# Patient Record
Sex: Male | Born: 1965 | Race: Asian | Hispanic: No | Marital: Married
Health system: Southern US, Community
[De-identification: ages and names within clinical notes are randomized; demographics above are authoritative.]

## PROBLEM LIST (undated history)

## (undated) ENCOUNTER — Emergency Department (HOSPITAL_BASED_OUTPATIENT_CLINIC_OR_DEPARTMENT_OTHER): Admission: EM | Payer: Self-pay | Source: Home / Self Care

---

## 1999-02-17 ENCOUNTER — Ambulatory Visit (HOSPITAL_COMMUNITY): Admission: RE | Admit: 1999-02-17 | Discharge: 1999-02-17 | Payer: Self-pay | Admitting: *Deleted

## 1999-03-08 ENCOUNTER — Encounter: Payer: Self-pay | Admitting: Urology

## 1999-03-08 ENCOUNTER — Ambulatory Visit (HOSPITAL_COMMUNITY): Admission: RE | Admit: 1999-03-08 | Discharge: 1999-03-08 | Payer: Self-pay | Admitting: Urology

## 2002-05-10 ENCOUNTER — Emergency Department (HOSPITAL_COMMUNITY): Admission: EM | Admit: 2002-05-10 | Discharge: 2002-05-10 | Payer: Self-pay | Admitting: Emergency Medicine

## 2003-05-12 ENCOUNTER — Emergency Department (HOSPITAL_COMMUNITY): Admission: AD | Admit: 2003-05-12 | Discharge: 2003-05-12 | Payer: Self-pay | Admitting: Family Medicine

## 2003-12-30 ENCOUNTER — Emergency Department (HOSPITAL_COMMUNITY): Admission: EM | Admit: 2003-12-30 | Discharge: 2003-12-30 | Payer: Self-pay | Admitting: Emergency Medicine

## 2007-01-03 ENCOUNTER — Emergency Department (HOSPITAL_COMMUNITY): Admission: EM | Admit: 2007-01-03 | Discharge: 2007-01-03 | Payer: Self-pay | Admitting: Emergency Medicine

## 2010-07-10 ENCOUNTER — Encounter: Admission: RE | Admit: 2010-07-10 | Discharge: 2010-07-10 | Payer: Self-pay | Admitting: Infectious Diseases

## 2019-12-02 ENCOUNTER — Emergency Department (HOSPITAL_COMMUNITY): Payer: No Typology Code available for payment source

## 2019-12-02 ENCOUNTER — Emergency Department (HOSPITAL_COMMUNITY)
Admission: EM | Admit: 2019-12-02 | Discharge: 2019-12-03 | Disposition: A | Discharge status: Home / Self Care | Payer: No Typology Code available for payment source | Attending: Emergency Medicine | Admitting: Emergency Medicine

## 2019-12-02 ENCOUNTER — Encounter (HOSPITAL_COMMUNITY): Payer: Self-pay | Admitting: *Deleted

## 2019-12-02 DIAGNOSIS — Y92410 Unspecified street and highway as the place of occurrence of the external cause: Secondary | ICD-10-CM | POA: Insufficient documentation

## 2019-12-02 DIAGNOSIS — S46812A Strain of other muscles, fascia and tendons at shoulder and upper arm level, left arm, initial encounter: Secondary | ICD-10-CM | POA: Diagnosis not present

## 2019-12-02 DIAGNOSIS — Y999 Unspecified external cause status: Secondary | ICD-10-CM | POA: Diagnosis not present

## 2019-12-02 DIAGNOSIS — Y93I9 Activity, other involving external motion: Secondary | ICD-10-CM | POA: Diagnosis not present

## 2019-12-02 DIAGNOSIS — R0789 Other chest pain: Secondary | ICD-10-CM | POA: Insufficient documentation

## 2019-12-02 NOTE — ED Notes (Signed)
Randy Arroyo [phone] person of contact if he needs a ride or anything. Wanted me to document he doesn't speak english very well so to contact her or a Nurse, learning disability.
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
## 2019-12-03 ENCOUNTER — Telehealth: Payer: Self-pay | Admitting: *Deleted

## 2019-12-03 MED ORDER — IBUPROFEN 800 MG PO TABS: 800.0000 mg | ORAL_TABLET | Freq: Once | ORAL | Status: DC

## 2019-12-03 MED ORDER — ACETAMINOPHEN 500 MG PO TABS: 1000.0000 mg | ORAL_TABLET | Freq: Once | ORAL | Status: DC
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
## 2019-12-03 NOTE — Discharge Instructions (Addendum)
Take 4 over the counter ibuprofen tablets 3 times a day or 2 over-the-counter naproxen tablets twice a day for pain. Also take tylenol 1000mg (2 extra strength) four times a day.   Follow-up with your doctor in about a week.  If you continue to have pain at that time please discuss with them they may want to repeat an imaging study on you.
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
## 2019-12-03 NOTE — Telephone Encounter (Signed)
Pt daughter called regarding discharge instructions for her dad.  RNCM read After Visit notes to daughter that were given to patient and advised to hav pt return to ED/Urgent Care as needed.
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
